# Patient Record
Sex: Female | Born: 1955 | Race: White | Hispanic: No | Smoking: Former smoker
Health system: Southern US, Community
[De-identification: ages and names within clinical notes are randomized; demographics above are authoritative.]

---

## 1998-05-28 ENCOUNTER — Other Ambulatory Visit: Admission: RE | Admit: 1998-05-28 | Discharge: 1998-05-28 | Payer: Self-pay | Admitting: Gynecology

## 1999-09-26 ENCOUNTER — Other Ambulatory Visit: Admission: RE | Admit: 1999-09-26 | Discharge: 1999-09-26 | Payer: Self-pay | Admitting: Gynecology

## 1999-12-30 ENCOUNTER — Other Ambulatory Visit: Admission: RE | Admit: 1999-12-30 | Discharge: 1999-12-30 | Payer: Self-pay | Admitting: Gynecology

## 2000-01-06 ENCOUNTER — Ambulatory Visit (HOSPITAL_COMMUNITY): Admission: RE | Admit: 2000-01-06 | Discharge: 2000-01-06 | Payer: Self-pay

## 2000-01-06 ENCOUNTER — Encounter: Payer: Self-pay | Admitting: Gynecology

## 2000-11-19 ENCOUNTER — Other Ambulatory Visit: Admission: RE | Admit: 2000-11-19 | Discharge: 2000-11-19 | Payer: Self-pay | Admitting: Gynecology

## 2001-01-21 ENCOUNTER — Ambulatory Visit (HOSPITAL_COMMUNITY): Admission: RE | Admit: 2001-01-21 | Discharge: 2001-01-21 | Payer: Self-pay | Admitting: Gynecology

## 2001-01-21 ENCOUNTER — Encounter: Payer: Self-pay | Admitting: Gynecology

## 2004-02-03 ENCOUNTER — Emergency Department (HOSPITAL_COMMUNITY): Admission: EM | Admit: 2004-02-03 | Discharge: 2004-02-03 | Payer: Self-pay | Admitting: Family Medicine

## 2005-05-24 ENCOUNTER — Ambulatory Visit (HOSPITAL_COMMUNITY): Admission: RE | Admit: 2005-05-24 | Discharge: 2005-05-24 | Payer: Self-pay | Admitting: Chiropractic Medicine

## 2005-12-08 ENCOUNTER — Other Ambulatory Visit: Admission: RE | Admit: 2005-12-08 | Discharge: 2005-12-08 | Payer: Self-pay | Admitting: Gynecology
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
## 2009-04-21 ENCOUNTER — Emergency Department (HOSPITAL_COMMUNITY): Admission: EM | Admit: 2009-04-21 | Discharge: 2009-04-21 | Payer: Self-pay | Admitting: Emergency Medicine

## 2011-01-30 ENCOUNTER — Encounter (HOSPITAL_COMMUNITY): Payer: Self-pay

## 2011-01-30 ENCOUNTER — Emergency Department (INDEPENDENT_AMBULATORY_CARE_PROVIDER_SITE_OTHER)
Admission: EM | Admit: 2011-01-30 | Discharge: 2011-01-30 | Disposition: A | Discharge status: Home / Self Care | Payer: BC Managed Care – PPO | Source: Home / Self Care | Attending: Family Medicine | Admitting: Family Medicine

## 2011-01-30 DIAGNOSIS — L272 Dermatitis due to ingested food: Secondary | ICD-10-CM

## 2011-01-30 MED ORDER — TRIAMCINOLONE ACETONIDE 40 MG/ML IJ SUSP
40.0000 mg | Freq: Once | INTRAMUSCULAR | Status: AC
  Administered 2011-01-30: 40 mg via INTRAMUSCULAR

## 2011-01-30 MED ORDER — HYDROXYZINE HCL 25 MG PO TABS: 25.0000 mg | ORAL_TABLET | Freq: Four times a day (QID) | ORAL | Status: AC

## 2011-01-30 MED ORDER — TRIAMCINOLONE ACETONIDE 40 MG/ML IJ SUSP
INTRAMUSCULAR | Status: AC
  Filled 2011-01-30: qty 5

## 2011-01-30 NOTE — ED Provider Notes (Signed)
History     CSN: [id number] Arrival date & time: 01/30/2011  9:34 AM   First MD Initiated Contact with Patient 01/30/11 1007      Chief Complaint  Patient presents with  . Allergic Reaction    (Consider location/radiation/quality/duration/timing/severity/associated sxs/prior treatment) Patient is a 55 y.o. female presenting with allergic reaction. The history is provided by the patient.  Allergic Reaction The primary symptoms are  nausea, diarrhea and rash. The primary symptoms do not include wheezing, shortness of breath, vomiting, angioedema or urticaria. The current episode started 13 to 24 hours ago. The problem has not changed since onset. The rash is associated with itching.  The onset of the reaction was associated with eating. Significant symptoms also include itching. Significant symptoms that are not present include eye redness or flushing.    History reviewed. No pertinent past medical history.  No past surgical history on file.  No family history on file.  History  Substance Use Topics  . Smoking status: Not on file  . Smokeless tobacco: Not on file  . Alcohol Use: Not on file    OB History    Grav Para Term Preterm Abortions TAB SAB Ect Mult Living                  Review of Systems  Constitutional: Negative.   Eyes: Negative for redness.  Respiratory: Negative for shortness of breath and wheezing.   Gastrointestinal: Positive for nausea and diarrhea. Negative for vomiting.  Genitourinary: Negative.   Skin: Positive for itching and rash. Negative for flushing.    Allergies  Review of patient's allergies indicates no known allergies.  Home Medications  No current outpatient prescriptions on file.  BP 105/71  Pulse 98  Temp(Src) 98 F (36.7 C) (Oral)  Resp 18  SpO2 100%  Physical Exam  Constitutional: She appears well-developed and well-nourished.  HENT:  Head: Normocephalic.  Mouth/Throat: Oropharynx is clear and moist.  Eyes: Pupils are  equal, round, and reactive to light.  Neck: Normal range of motion. Neck supple.  Cardiovascular: Normal rate, normal heart sounds and intact distal pulses.   Pulmonary/Chest: Effort normal and breath sounds normal.  Abdominal: Soft. Bowel sounds are normal.  Skin: Skin is warm and dry.    ED Course  Procedures (including critical care time)  Labs Reviewed - No data to display No results found.   No diagnosis found.    MDM          Barkley Bruns, MD 01/30/11 1026

## 2013-09-06 ENCOUNTER — Other Ambulatory Visit (HOSPITAL_COMMUNITY)
Admission: RE | Admit: 2013-09-06 | Discharge: 2013-09-06 | Disposition: A | Discharge status: Home / Self Care | Payer: BC Managed Care – PPO | Source: Ambulatory Visit | Attending: Women's Health | Admitting: Women's Health

## 2013-09-06 ENCOUNTER — Ambulatory Visit (INDEPENDENT_AMBULATORY_CARE_PROVIDER_SITE_OTHER): Payer: BC Managed Care – PPO | Admitting: Women's Health

## 2013-09-06 ENCOUNTER — Encounter: Payer: Self-pay | Admitting: Women's Health

## 2013-09-06 VITALS — BP 114/74 | Ht 66.0 in | Wt 153.2 lb

## 2013-09-06 DIAGNOSIS — R87810 Cervical high risk human papillomavirus (HPV) DNA test positive: Secondary | ICD-10-CM | POA: Insufficient documentation

## 2013-09-06 DIAGNOSIS — C4491 Basal cell carcinoma of skin, unspecified: Secondary | ICD-10-CM

## 2013-09-06 DIAGNOSIS — Z01419 Encounter for gynecological examination (general) (routine) without abnormal findings: Secondary | ICD-10-CM | POA: Insufficient documentation

## 2013-09-06 DIAGNOSIS — Z13220 Encounter for screening for lipoid disorders: Secondary | ICD-10-CM

## 2013-09-06 DIAGNOSIS — Z1151 Encounter for screening for human papillomavirus (HPV): Secondary | ICD-10-CM | POA: Insufficient documentation

## 2013-09-06 LAB — COMPREHENSIVE METABOLIC PANEL
ALBUMIN: 4.4 g/dL (ref 3.5–5.2)
ALT: 14 U/L (ref 0–35)
AST: 18 U/L (ref 0–37)
Alkaline Phosphatase: 58 U/L (ref 39–117)
BUN: 12 mg/dL (ref 6–23)
CALCIUM: 9.6 mg/dL (ref 8.4–10.5)
CHLORIDE: 96 meq/L (ref 96–112)
CO2: 28 mEq/L (ref 19–32)
Creat: 0.55 mg/dL (ref 0.50–1.10)
Glucose, Bld: 87 mg/dL (ref 70–99)
Potassium: 4.6 mEq/L (ref 3.5–5.3)
Sodium: 132 mEq/L — ABNORMAL LOW (ref 135–145)
Total Bilirubin: 0.7 mg/dL (ref 0.2–1.2)
Total Protein: 6.6 g/dL (ref 6.0–8.3)

## 2013-09-06 LAB — CBC WITH DIFFERENTIAL/PLATELET
Basophils Absolute: 0.1 10*3/uL (ref 0.0–0.1)
Basophils Relative: 1 % (ref 0–1)
Eosinophils Absolute: 0.1 10*3/uL (ref 0.0–0.7)
Eosinophils Relative: 1 % (ref 0–5)
HEMATOCRIT: 39.7 % (ref 36.0–46.0)
Hemoglobin: 13.2 g/dL (ref 12.0–15.0)
Lymphocytes Relative: 20 % (ref 12–46)
Lymphs Abs: 1.3 10*3/uL (ref 0.7–4.0)
MCH: 29.2 pg (ref 26.0–34.0)
MCHC: 33.2 g/dL (ref 30.0–36.0)
MCV: 87.8 fL (ref 78.0–100.0)
MONO ABS: 0.3 10*3/uL (ref 0.1–1.0)
MONOS PCT: 5 % (ref 3–12)
Neutro Abs: 4.7 10*3/uL (ref 1.7–7.7)
Neutrophils Relative %: 73 % (ref 43–77)
Platelets: 295 10*3/uL (ref 150–400)
RBC: 4.52 MIL/uL (ref 3.87–5.11)
RDW: 14 % (ref 11.5–15.5)
WBC: 6.5 10*3/uL (ref 4.0–10.5)

## 2013-09-06 LAB — LIPID PANEL
Cholesterol: 200 mg/dL (ref 0–200)
HDL: 89 mg/dL (ref >39)
LDL Cholesterol: 99 mg/dL (ref 0–99)
Total CHOL/HDL Ratio: 2.2 Ratio
Triglycerides: 62 mg/dL (ref <150)
VLDL: 12 mg/dL (ref 0–40)

## 2013-09-06 MED ORDER — ALPRAZOLAM 0.25 MG PO TABS
0.2500 mg | ORAL_TABLET | Freq: Every evening | ORAL | Status: DC | PRN
Start: 2013-09-06 — End: 2014-06-29

## 2013-09-06 NOTE — Patient Instructions (Signed)
Health Recommendations for Postmenopausal Women Respected and ongoing research has looked at the most common causes of death, disability, and poor quality of life in postmenopausal women. The causes include heart disease, diseases of blood vessels, diabetes, depression, cancer, and bone loss (osteoporosis). Many things can be done to help lower the chances of developing these and other common problems: CARDIOVASCULAR DISEASE Heart Disease: A heart attack is a medical emergency. Know the signs and symptoms of a heart attack. Below are things women can do to reduce their risk for heart disease.   Do not smoke. If you smoke, quit.  Aim for a healthy weight. Being overweight causes many preventable deaths. Eat a healthy and balanced diet and drink an adequate amount of liquids.  Get moving. Make a commitment to be more physically active. Aim for 30 minutes of activity on most, if not all days of the week.  Eat for heart health. Choose a diet that is low in saturated fat and cholesterol and eliminate trans fat. Include whole grains, vegetables, and fruits. Read and understand the labels on food containers before buying.  Know your numbers. Ask your caregiver to check your blood pressure, cholesterol (total, HDL, LDL, triglycerides) and blood glucose. Work with your caregiver on improving your entire clinical picture.  High blood pressure. Limit or stop your table salt intake (try salt substitute and food seasonings). Avoid salty foods and drinks. Read labels on food containers before buying. Eating well and exercising can help control high blood pressure. STROKE  Stroke is a medical emergency. Stroke may be the result of a blood clot in a blood vessel in the brain or by a brain hemorrhage (bleeding). Know the signs and symptoms of a stroke. To lower the risk of developing a stroke:  Avoid fatty foods.  Quit smoking.  Control your diabetes, blood pressure, and irregular heart rate. THROMBOPHLEBITIS  (BLOOD CLOT) OF THE LEG  Becoming overweight and leading a stationary lifestyle may also contribute to developing blood clots. Controlling your diet and exercising will help lower the risk of developing blood clots. CANCER SCREENING  Breast Cancer: Take steps to reduce your risk of breast cancer.  You should practice "breast self-awareness." This means understanding the normal appearance and feel of your breasts and should include breast self-examination. Any changes detected, no matter how small, should be reported to your caregiver.  After age 40, you should have a clinical breast exam (CBE) every year.  Starting at age 40, you should consider having a mammogram (breast X-ray) every year.  If you have a family history of breast cancer, talk to your caregiver about genetic screening.  If you are at high risk for breast cancer, talk to your caregiver about having an MRI and a mammogram every year.  Intestinal or Stomach Cancer: Tests to consider are a rectal exam, fecal occult blood, sigmoidoscopy, and colonoscopy. Women who are high risk may need to be screened at an earlier age and more often.  Cervical Cancer:  Beginning at age 30, you should have a Pap test every 3 years as long as the past 3 Pap tests have been normal.  If you have had past treatment for cervical cancer or a condition that could lead to cancer, you need Pap tests and screening for cancer for at least 20 years after your treatment.  If you had a hysterectomy for a problem that was not cancer or a condition that could lead to cancer, then you no longer need Pap tests.    If you are between ages 65 and 70, and you have had normal Pap tests going back 10 years, you no longer need Pap tests.  If Pap tests have been discontinued, risk factors (such as a new sexual partner) need to be reassessed to determine if screening should be resumed.  Some medical problems can increase the chance of getting cervical cancer. In these  cases, your caregiver may recommend more frequent screening and Pap tests.  Uterine Cancer: If you have vaginal bleeding after reaching menopause, you should notify your caregiver.  Ovarian cancer: Other than yearly pelvic exams, there are no reliable tests available to screen for ovarian cancer at this time except for yearly pelvic exams.  Lung Cancer: Yearly chest X-rays can detect lung cancer and should be done on high risk women, such as cigarette smokers and women with chronic lung disease (emphysema).  Skin Cancer: A complete body skin exam should be done at your yearly examination. Avoid overexposure to the sun and ultraviolet light lamps. Use a strong sun block cream when in the sun. All of these things are important in lowering the risk of skin cancer. MENOPAUSE Menopause Symptoms: Hormone therapy products are effective for treating symptoms associated with menopause:  Moderate to severe hot flashes.  Night sweats.  Mood swings.  Headaches.  Tiredness.  Loss of sex drive.  Insomnia.  Other symptoms. Hormone replacement carries certain risks, especially in older women. Women who use or are thinking about using estrogen or estrogen with progestin treatments should discuss that with their caregiver. Your caregiver will help you understand the benefits and risks. The ideal dose of hormone replacement therapy is not known. The Food and Drug Administration (FDA) has concluded that hormone therapy should be used only at the lowest doses and for the shortest amount of time to reach treatment goals.  OSTEOPOROSIS Protecting Against Bone Loss and Preventing Fracture: If you use hormone therapy for prevention of bone loss (osteoporosis), the risks for bone loss must outweigh the risk of the therapy. Ask your caregiver about other medications known to be safe and effective for preventing bone loss and fractures. To guard against bone loss or fractures, the following is recommended:  If  you are less than age 50, take 1000 mg of calcium and at least 600 mg of Vitamin D per day.  If you are greater than age 50 but less than age 70, take 1200 mg of calcium and at least 600 mg of Vitamin D per day.  If you are greater than age 70, take 1200 mg of calcium and at least 800 mg of Vitamin D per day. Smoking and excessive alcohol intake increases the risk of osteoporosis. Eat foods rich in calcium and vitamin D and do weight bearing exercises several times a week as your caregiver suggests. DIABETES Diabetes Melitus: If you have Type I or Type 2 diabetes, you should keep your blood sugar under control with diet, exercise and recommended medication. Avoid too many sweets, starchy and fatty foods. Being overweight can make control more difficult. COGNITION AND MEMORY Cognition and Memory: Menopausal hormone therapy is not recommended for the prevention of cognitive disorders such as Alzheimer's disease or memory loss.  DEPRESSION  Depression may occur at any age, but is common in elderly women. The reasons may be because of physical, medical, social (loneliness), or financial problems and needs. If you are experiencing depression because of medical problems and control of symptoms, talk to your caregiver about this. Physical activity and   exercise may help with mood and sleep. Community and volunteer involvement may help your sense of value and worth. If you have depression and you feel that the problem is getting worse or becoming severe, talk to your caregiver about treatment options that are best for you. ACCIDENTS  Accidents are common and can be serious in the elderly woman. Prepare your house to prevent accidents. Eliminate throw rugs, place hand bars in the bath, shower and toilet areas. Avoid wearing high heeled shoes or walking on wet, snowy, and icy areas. Limit or stop driving if you have vision or hearing problems, or you feel you are unsteady with you movements and  reflexes. HEPATITIS C Hepatitis C is a type of viral infection affecting the liver. It is spread mainly through contact with blood from an infected person. It can be treated, but if left untreated, it can lead to severe liver damage over years. Many people who are infected do not know that the virus is in their blood. If you are a "baby-boomer", it is recommended that you have one screening test for Hepatitis C. IMMUNIZATIONS  Several immunizations are important to consider having during your senior years, including:   Tetanus, diptheria, and pertussis booster shot.  Influenza every year before the flu season begins.  Pneumonia vaccine.  Shingles vaccine.  Others as indicated based on your specific needs. Talk to your caregiver about these. Document Released: 04/17/2005 Document Revised: 02/10/2012 Document Reviewed: 12/12/2007 [facility] Patient Information 2015 [place], [place]. This information is not intended to replace advice given to you by your health care provider. Make sure you discuss any questions you have with your health care provider.

## 2013-09-06 NOTE — Progress Notes (Signed)
Erica Tapia 1955-09-18 [id number]    History:    Presents for annual exam.  Postmenopausal/no HRT/no bleeding. Has had minimal healthcare in the past 10 years, was started on Zoloft 50 per primary care in January which has helped, requested something for air flights. Smokes one to 2 cigarettes weekly. History of normal mammograms last 1 2002. Cryo- greater than 20 years ago and normal Paps after. Has not had a colonoscopy. 2015 basal skin cancer right cheek has followup scheduled.  Past medical history, past surgical history, family history and social history were all reviewed and documented in the EPIC chart. Works for the [employer].  ROS:  A  12 point ROS was performed and pertinent positives and negatives are included.  Exam:  Filed Vitals:   09/06/13 0857  BP: 114/74    General appearance:  Normal Thyroid:  Symmetrical, normal in size, without palpable masses or nodularity. Respiratory  Auscultation:  Clear without wheezing or rhonchi Cardiovascular  Auscultation:  Regular rate, without rubs, murmurs or gallops  Edema/varicosities:  Not grossly evident Abdominal  Soft,nontender, without masses, guarding or rebound.  Liver/spleen:  No organomegaly noted  Hernia:  None appreciated  Skin  Inspection:  Grossly normal   Breasts: Examined lying and sitting.     Right: Without masses, retractions, discharge or axillary adenopathy.     Left: Without masses, retractions, discharge or axillary adenopathy. Gentitourinary   Inguinal/mons:  Normal without inguinal adenopathy  External genitalia:  Normal  BUS/Urethra/Skene's glands:  Normal  Vagina:  Normal  Cervix:  Normal  Uterus:   normal in size, shape and contour.  Midline and mobile  Adnexa/parametria:     Rt: Without masses or tenderness.   Lt: Without masses or tenderness.  Anus and perineum: Normal  Digital rectal exam: Normal sphincter tone without palpated masses or tenderness  Assessment/Plan:  58 y.o. MWF G0  for annual exam.   Minimal healthcare approximately 10 years Postmenopausal/no HRT/no bleeding Anxiety/depression stable on Zoloft per primary care Minimal smoker Basal skin cancer face has followup scheduled  Plan: Aware of need for no smoking, plans to quit. SBE's, breast center number and information given and reviewed instructed to schedule ASAP. Colonoscopy, instructed to schedule. Xanax 0.25 when necessary for air flights prescription, proper use, aware of addictive properties. CBC, comprehensive metabolic panel, lipid panel, vitamin D, UA, Pap with HR HPV typing. Schedule DEXA here.   Note: This dictation was prepared with Dragon/digital dictation.  Any transcriptional errors that result are unintentional. PHILOMENE, HAFF [facility], 9:39 AM 09/06/2013

## 2013-09-06 NOTE — Addendum Note (Signed)
Addended by: Alen Blew on: 09/06/2013 10:00 AM   Modules accepted: Orders

## 2013-09-07 LAB — URINALYSIS W MICROSCOPIC + REFLEX CULTURE
Bacteria, UA: NONE SEEN
Bilirubin Urine: NEGATIVE
CASTS: NONE SEEN
CRYSTALS: NONE SEEN
GLUCOSE, UA: NEGATIVE mg/dL
Hgb urine dipstick: NEGATIVE
Ketones, ur: NEGATIVE mg/dL
LEUKOCYTES UA: NEGATIVE
Nitrite: NEGATIVE
PH: 6 (ref 5.0–8.0)
Protein, ur: NEGATIVE mg/dL
SQUAMOUS EPITHELIAL / LPF: NONE SEEN
Urobilinogen, UA: 0.2 mg/dL (ref 0.0–1.0)

## 2013-09-07 LAB — CYTOLOGY - PAP

## 2013-09-07 LAB — VITAMIN D 25 HYDROXY (VIT D DEFICIENCY, FRACTURES): Vit D, 25-Hydroxy: 20 ng/mL — ABNORMAL LOW (ref 30–89)

## 2013-09-14 ENCOUNTER — Other Ambulatory Visit: Payer: Self-pay | Admitting: Women's Health

## 2013-09-14 ENCOUNTER — Other Ambulatory Visit: Payer: Self-pay

## 2013-09-14 DIAGNOSIS — E559 Vitamin D deficiency, unspecified: Secondary | ICD-10-CM

## 2013-09-14 MED ORDER — VITAMIN D (ERGOCALCIFEROL) 1.25 MG (50000 UNIT) PO CAPS: 50000.0000 [IU] | ORAL_CAPSULE | ORAL | Status: AC

## 2013-11-08 ENCOUNTER — Other Ambulatory Visit: Payer: Self-pay | Admitting: Gynecology

## 2013-11-08 DIAGNOSIS — Z13820 Encounter for screening for osteoporosis: Secondary | ICD-10-CM

## 2013-11-23 ENCOUNTER — Other Ambulatory Visit: Payer: Self-pay | Admitting: Gynecology

## 2013-11-23 ENCOUNTER — Ambulatory Visit (INDEPENDENT_AMBULATORY_CARE_PROVIDER_SITE_OTHER): Payer: BC Managed Care – PPO

## 2013-11-23 DIAGNOSIS — M949 Disorder of cartilage, unspecified: Secondary | ICD-10-CM

## 2013-11-23 DIAGNOSIS — M899 Disorder of bone, unspecified: Secondary | ICD-10-CM

## 2013-11-23 DIAGNOSIS — Z13820 Encounter for screening for osteoporosis: Secondary | ICD-10-CM

## 2013-11-23 DIAGNOSIS — M8580 Other specified disorders of bone density and structure, unspecified site: Secondary | ICD-10-CM

## 2013-11-27 ENCOUNTER — Other Ambulatory Visit: Payer: Self-pay | Admitting: *Deleted

## 2013-12-01 ENCOUNTER — Other Ambulatory Visit: Payer: BC Managed Care – PPO

## 2013-12-01 DIAGNOSIS — E559 Vitamin D deficiency, unspecified: Secondary | ICD-10-CM

## 2013-12-02 LAB — VITAMIN D 25 HYDROXY (VIT D DEFICIENCY, FRACTURES): VIT D 25 HYDROXY: 72 ng/mL (ref 30–89)

## 2013-12-15 ENCOUNTER — Ambulatory Visit: Payer: BC Managed Care – PPO | Admitting: Gynecology

## 2014-01-08 ENCOUNTER — Ambulatory Visit: Payer: BC Managed Care – PPO | Admitting: Gynecology

## 2014-01-10 ENCOUNTER — Emergency Department (HOSPITAL_COMMUNITY)
Admission: EM | Admit: 2014-01-10 | Discharge: 2014-01-10 | Disposition: A | Discharge status: Home / Self Care | Payer: BC Managed Care – PPO | Source: Home / Self Care | Attending: Emergency Medicine | Admitting: Emergency Medicine

## 2014-01-10 ENCOUNTER — Encounter (HOSPITAL_COMMUNITY): Payer: Self-pay | Admitting: *Deleted

## 2014-01-10 DIAGNOSIS — L42 Pityriasis rosea: Secondary | ICD-10-CM

## 2014-01-10 LAB — POCT RAPID STREP A: Streptococcus, Group A Screen (Direct): NEGATIVE

## 2014-01-10 MED ORDER — HYDROXYZINE HCL 10 MG PO TABS: ORAL_TABLET | ORAL | Status: AC

## 2014-01-10 NOTE — ED Provider Notes (Signed)
CSN: [id number]     Arrival date & time 01/10/14  0973 History   First MD Initiated Contact with Patient 01/10/14 [phone]     Chief Complaint  Patient presents with  . Rash   (Consider location/radiation/quality/duration/timing/severity/associated sxs/prior Treatment) HPI Comments: No previous episodes No known new exposures No household members with same No fever/malaise  Patient is a 58 y.o. female presenting with rash. The history is provided by the patient.  Rash Location:  Head/neck, torso, shoulder/arm and leg Head/neck rash location:  L neck and R neck Shoulder/arm rash location:  L shoulder, R shoulder, L arm, R arm, L upper arm, R upper arm, L forearm and R forearm Torso rash location:  L chest, R chest, R flank, L flank, upper back, lower back, abd LUQ, abd LLQ, abd RUQ and abd RLQ Leg rash location:  L upper leg and R upper leg Quality: itchiness and redness   Quality: not blistering, not bruising, not burning, not painful, not scaling and not swelling   Onset quality:  Gradual Duration:  1 week Timing:  Constant Progression:  Spreading Chronicity:  New Associated symptoms: sore throat and URI     History reviewed. No pertinent past medical history. History reviewed. No pertinent past surgical history. Family History  Problem Relation Age of Onset  . Diabetes Mother   . Heart attack Mother   . Diabetes Maternal Aunt    History  Substance Use Topics  . Smoking status: Current Some Day Smoker    Types: Cigarettes  . Smokeless tobacco: Never Used  . Alcohol Use: Yes     Comment: beer   OB History    Gravida Para Term Preterm AB TAB SAB Ectopic Multiple Living   0              Review of Systems  HENT: Positive for sore throat.   Skin: Positive for rash.  All other systems reviewed and are negative.   Allergies  Review of patient's allergies indicates no known allergies.  Home Medications   Prior to Admission medications   Medication Sig Start Date  End Date Taking? Authorizing Provider  ALPRAZolam (XANAX) 0.25 MG tablet Take 1 tablet (0.25 mg total) by mouth at bedtime as needed for anxiety. 09/06/13   Huel Cote, NP  Ascorbic Acid (VITAMIN C PO) Take by mouth.    Historical Provider, MD  Cholecalciferol (VITAMIN D PO) Take by mouth.    Historical Provider, MD  hydrOXYzine (ATARAX/VISTARIL) 10 MG tablet 1-2 tabs po TID prn itching 01/10/14   Lutricia Feil, PA  Multiple Vitamin (MULTIVITAMIN) tablet Take 1 tablet by mouth daily.    Historical Provider, MD  sertraline (ZOLOFT) 50 MG tablet Take 50 mg by mouth daily.    Historical Provider, MD  Vitamin D, Ergocalciferol, (DRISDOL) 50000 UNITS CAPS capsule Take 1 capsule (50,000 Units total) by mouth every 7 (seven) days. 09/14/13   Huel Cote, NP   BP 113/75 mmHg  Pulse 96  Temp(Src) 99.6 F (37.6 C) (Oral)  Resp 16  SpO2 96% Physical Exam  Constitutional: She is oriented to person, place, and time. She appears well-developed and well-nourished. No distress.  HENT:  Head: Normocephalic and atraumatic.  Right Ear: Hearing, tympanic membrane, external ear and ear canal normal.  Left Ear: Hearing, tympanic membrane, external ear and ear canal normal.  Nose: Nose normal.  Mouth/Throat: Uvula is midline, oropharynx is clear and moist and mucous membranes are normal. No oral lesions. No  uvula swelling.  Eyes: Conjunctivae are normal. No scleral icterus.  Neck: Normal range of motion. Neck supple.  Cardiovascular: Normal rate, regular rhythm and normal heart sounds.   Pulmonary/Chest: Effort normal and breath sounds normal. No respiratory distress. She has no wheezes.  Musculoskeletal: Normal range of motion.  Lymphadenopathy:    She has no cervical adenopathy.  Neurological: She is alert and oriented to person, place, and time.  Skin: Skin is warm and dry. Rash noted.  Diffuse blanchable discrete dry erythematous macules of variable size at base of neck, over torso and back,  B/L UEs and bilateral inner thighs  Psychiatric: She has a normal mood and affect. Her behavior is normal.  Nursing note and vitals reviewed.   ED Course  Procedures (including critical care time) Labs Review Labs Reviewed  POCT RAPID STREP A ([facility]RG CARE ONLY)    Imaging Review No results found.   MDM   1. Pityriasis rosea   Strep test negative Atarax as prescribed for pruritis Advised patient regarding condition and expected self limited course   Lutricia Feil, PA 01/10/14 1008

## 2014-01-10 NOTE — ED Notes (Signed)
Rash        Over  Top of  Torso     Red        Lesions      No angioedema         The     Pt  Is  Sitting  Upright on  The  Exam   tablespeaking in  Complete   sentances       Symptoms  X  1  Week      Pt reports   A  sorethroat         As  Well

## 2014-01-10 NOTE — Discharge Instructions (Signed)
Strep test was negative.  Pityriasis Rosea Pityriasis rosea is a rash which is probably caused by a virus. It generally starts as a scaly, red patch on the trunk (the area of the body that a t-shirt would cover) but does not appear on sun exposed areas. The rash is usually preceded by an initial larger spot called the "herald patch" a week or more before the rest of the rash appears. Generally within one to two days the rash appears rapidly on the trunk, upper arms, and sometimes the upper legs. The rash usually appears as flat, oval patches of scaly pink color. The rash can also be raised and one is able to feel it with a finger. The rash can also be finely crinkled and may slough off leaving a ring of scale around the spot. Sometimes a mild sore throat is present with the rash. It usually affects children and young adults in the spring and autumn. Women are more frequently affected than men. TREATMENT  Pityriasis rosea is a self-limited condition. This means it goes away within 4 to 8 weeks without treatment. The spots may persist for several months, especially in darker-colored skin after the rash has resolved and healed. Benadryl and steroid creams may be used if itching is a problem. SEEK MEDICAL CARE IF:   Your rash does not go away or persists longer than three months.  You develop fever and joint pain.  You develop severe headache and confusion.  You develop breathing difficulty, vomiting and/or extreme weakness. Document Released: 04/01/2001 Document Revised: 05/18/2011 Document Reviewed: 04/20/2008 [facility] Patient Information 2015 [place], [place]. This information is not intended to replace advice given to you by your health care provider. Make sure you discuss any questions you have with your health care provider.

## 2014-01-12 LAB — CULTURE, GROUP A STREP

## 2014-01-17 ENCOUNTER — Encounter: Payer: Self-pay | Admitting: Gynecology

## 2014-01-17 ENCOUNTER — Ambulatory Visit (INDEPENDENT_AMBULATORY_CARE_PROVIDER_SITE_OTHER): Payer: BC Managed Care – PPO | Admitting: Gynecology

## 2014-01-17 VITALS — BP 130/80

## 2014-01-17 DIAGNOSIS — Z23 Encounter for immunization: Secondary | ICD-10-CM

## 2014-01-17 DIAGNOSIS — R8789 Other abnormal findings in specimens from female genital organs: Secondary | ICD-10-CM

## 2014-01-17 DIAGNOSIS — R87618 Other abnormal cytological findings on specimens from cervix uteri: Secondary | ICD-10-CM | POA: Insufficient documentation

## 2014-01-17 DIAGNOSIS — B977 Papillomavirus as the cause of diseases classified elsewhere: Secondary | ICD-10-CM

## 2014-01-17 NOTE — Progress Notes (Signed)
   Patient is a 58 year old who presented to the office today for colposcopic evaluation as a result of her recent Pap smear at time of her annual exam which demonstrated the following:  Normal Pap smear but:  HPV 16,18/45 Genotyping "POSITIVE" HPV 18/45 Completed by [id number] on 2013-09-13 HPV High Risk **DETECTED**  Patient stated she had cryotherapy of her cervix over 20 years ago and subsequent Pap smears have been normal.  Colposcopic exam: Patient underwent extensive colposcopic evaluation of the external genitalia, perineum, perirectal region. The speculum was introduced into the vagina and a systematic inspection of the vagina cervix and fornices were undertaken. Acetic acid was then applied. The cervix required dilatation effort to obtain an ECC since no lesions were seen on the cervix. Upon manipulating the speculum she was noted to have 2 separate islands of leukoplakic area which were respectively biopsy. V THIS APPEARS TO BE POSSIBLY  VAIN-1.  Assessment/plan: Patient with normal Pap smear but HPV 1845 were identified. Biopsy obtained results pending at time of this dictation will notify patient results and manage accordingly. Patient received the T dTap Vaccine today.

## 2014-01-17 NOTE — Patient Instructions (Signed)
Tdap Vaccine (Tetanus, Diphtheria, Pertussis): What You Need to Know 1. Why get vaccinated? Tetanus, diphtheria and pertussis can be very serious diseases, even for adolescents and adults. Tdap vaccine can protect us from these diseases. TETANUS (Lockjaw) causes painful muscle tightening and stiffness, usually all over the body.  It can lead to tightening of muscles in the head and neck so you can't open your mouth, swallow, or sometimes even breathe. Tetanus kills about 1 out of 5 people who are infected. DIPHTHERIA can cause a thick coating to form in the back of the throat.  It can lead to breathing problems, paralysis, heart failure, and death. PERTUSSIS (Whooping Cough) causes severe coughing spells, which can cause difficulty breathing, vomiting and disturbed sleep.  It can also lead to weight loss, incontinence, and rib fractures. Up to 2 in 100 adolescents and 5 in 100 adults with pertussis are hospitalized or have complications, which could include pneumonia or death. These diseases are caused by bacteria. Diphtheria and pertussis are spread from person to person through coughing or sneezing. Tetanus enters the body through cuts, scratches, or wounds. Before vaccines, the [place] saw as many as 200,000 cases a year of diphtheria and pertussis, and hundreds of cases of tetanus. Since vaccination began, tetanus and diphtheria have dropped by about 99% and pertussis by about 80%. 2. Tdap vaccine Tdap vaccine can protect adolescents and adults from tetanus, diphtheria, and pertussis. One dose of Tdap is routinely given at age 11 or 12. People who did not get Tdap at that age should get it as soon as possible. Tdap is especially important for health care professionals and anyone having close contact with a baby younger than 12 months. Pregnant women should get a dose of Tdap during every pregnancy, to protect the newborn from pertussis. Infants are most at risk for severe, life-threatening  complications from pertussis. A similar vaccine, called Td, protects from tetanus and diphtheria, but not pertussis. A Td booster should be given every 10 years. Tdap may be given as one of these boosters if you have not already gotten a dose. Tdap may also be given after a severe cut or burn to prevent tetanus infection. Your doctor can give you more information. Tdap may safely be given at the same time as other vaccines. 3. Some people should not get this vaccine  If you ever had a life-threatening allergic reaction after a dose of any tetanus, diphtheria, or pertussis containing vaccine, OR if you have a severe allergy to any part of this vaccine, you should not get Tdap. Tell your doctor if you have any severe allergies.  If you had a coma, or long or multiple seizures within 7 days after a childhood dose of DTP or DTaP, you should not get Tdap, unless a cause other than the vaccine was found. You can still get Td.  Talk to your doctor if you:  have epilepsy or another nervous system problem,  had severe pain or swelling after any vaccine containing diphtheria, tetanus or pertussis,  ever had Guillain-Barr Syndrome (GBS),  aren't feeling well on the day the shot is scheduled. 4. Risks of a vaccine reaction With any medicine, including vaccines, there is a chance of side effects. These are usually mild and go away on their own, but serious reactions are also possible. Brief fainting spells can follow a vaccination, leading to injuries from falling. Sitting or lying down for about 15 minutes can help prevent these. Tell your doctor if you feel   dizzy or light-headed, or have vision changes or ringing in the ears. Mild problems following Tdap (Did not interfere with activities)  Pain where the shot was given (about 3 in 4 adolescents or 2 in 3 adults)  Redness or swelling where the shot was given (about 1 person in 5)  Mild fever of at least 100.78F (up to about 1 in 25 adolescents or  1 in 100 adults)  Headache (about 3 or 4 people in 10)  Tiredness (about 1 person in 3 or 4)  Nausea, vomiting, diarrhea, stomach ache (up to 1 in 4 adolescents or 1 in 10 adults)  Chills, body aches, sore joints, rash, swollen glands (uncommon) Moderate problems following Tdap (Interfered with activities, but did not require medical attention)  Pain where the shot was given (about 1 in 5 adolescents or 1 in 100 adults)  Redness or swelling where the shot was given (up to about 1 in 16 adolescents or 1 in 25 adults)  Fever over 102F (about 1 in 100 adolescents or 1 in 250 adults)  Headache (about 3 in 20 adolescents or 1 in 10 adults)  Nausea, vomiting, diarrhea, stomach ache (up to 1 or 3 people in 100)  Swelling of the entire arm where the shot was given (up to about 3 in 100). Severe problems following Tdap (Unable to perform usual activities; required medical attention)  Swelling, severe pain, bleeding and redness in the arm where the shot was given (rare). A severe allergic reaction could occur after any vaccine (estimated less than 1 in a million doses). 5. What if there is a serious reaction? What should I look for?  Look for anything that concerns you, such as signs of a severe allergic reaction, very high fever, or behavior changes. Signs of a severe allergic reaction can include hives, swelling of the face and throat, difficulty breathing, a fast heartbeat, dizziness, and weakness. These would start a few minutes to a few hours after the vaccination. What should I do?  If you think it is a severe allergic reaction or other emergency that can't wait, call 9-1-1 or get the person to the nearest hospital. Otherwise, call your doctor.  Afterward, the reaction should be reported to the "Vaccine Adverse Event Reporting System" (VAERS). Your doctor might file this report, or you can do it yourself through the VAERS web site at www.vaers.SamedayNews.es, or by calling  531-741-7761. VAERS is only for reporting reactions. They do not give medical advice.  6. The National Vaccine Injury Compensation Program The [employer] Vaccine Injury Compensation Program (VICP) is a federal program that was created to compensate people who may have been injured by certain vaccines. Persons who believe they may have been injured by a vaccine can learn about the program and about filing a claim by calling 347 770 6279 or visiting the [place] website at GoldCloset.com.ee. 7. How can I learn more?  Ask your doctor.  Call your local or state health department.  Contact the Centers for Disease Control and Prevention (CDC):  Call (563) 010-0759 or visit CDC's website at http://hunter.com/. CDC Tdap Vaccine VIS (07/16/11) Document Released: 08/25/2011 Document Revised: 07/10/2013 Document Reviewed: 06/07/2013 ExitCare Patient Information 2015 [place], [place]. This information is not intended to replace advice given to you by your health care provider. Make sure you discuss any questions you have with your health care provider. Colposcopy Colposcopy is a procedure to examine your cervix and vagina, or the area around the outside of your vagina, for abnormalities or signs of  disease. The procedure is done using a lighted microscope called a colposcope. Tissue samples may be collected during the colposcopy if your health care provider finds any unusual cells. A colposcopy may be done if a woman has:  An abnormal Pap test. A Pap test is a medical test done to evaluate cells that are on the surface of the cervix.  A Pap test result that is suggestive of human papillomavirus (HPV). This virus can cause genital warts and is linked to the development of cervical cancer.  A sore on her cervix and the results of a Pap test were normal.  Genital warts on the cervix or in or around the outside of the vagina.  A mother who took the drug diethylstilbestrol (DES) while  pregnant.  Painful intercourse.  Vaginal bleeding, especially after sexual intercourse. LET [facility] CARE PROVIDER KNOW ABOUT:  Any allergies you have.  All medicines you are taking, including vitamins, herbs, eye drops, creams, and over-the-counter medicines.  Previous problems you or members of your family have had with the use of anesthetics.  Any blood disorders you have.  Previous surgeries you have had.  Medical conditions you have. RISKS AND COMPLICATIONS Generally, a colposcopy is a safe procedure. However, as with any procedure, complications can occur. Possible complications include:  Bleeding.  Infection.  Missed lesions. BEFORE THE PROCEDURE   Tell your health care provider if you have your menstrual period. A colposcopy typically is not done during menstruation.  For 24 hours before the colposcopy, do not:  [place].  Use tampons.  Use medicines, creams, or suppositories in the vagina.  Have sexual intercourse. PROCEDURE  During the procedure, you will be lying on your back with your feet in foot rests (stirrups). A warm metal or plastic instrument (speculum) will be placed in your vagina to keep it open and to allow the health care provider to see the cervix. The colposcope will be placed outside the vagina. It will be used to magnify and examine the cervix, vagina, and the area around the outside of the vagina. A small amount of liquid solution will be placed on the area that is to be viewed. This solution will make it easier to see the abnormal cells. Your health care provider will use tools to suck out mucus and cells from the canal of the cervix. Then he or she will record the location of the abnormal areas. If a biopsy is done during the procedure, a medicine will usually be given to numb the area (local anesthetic). You may feel mild pain or cramping while the biopsy is done. After the procedure, tissue samples collected during the biopsy will be sent to  a lab for analysis. AFTER THE PROCEDURE  You will be given instructions on when to follow up with your health care provider for your test results. It is important to keep your appointment. Document Released: 05/16/2002 Document Revised: 10/26/2012 Document Reviewed: 09/22/2012 [facility] Patient Information 2015 [place], [place]. This information is not intended to replace advice given to you by your health care provider. Make sure you discuss any questions you have with your health care provider.

## 2014-01-23 ENCOUNTER — Encounter: Payer: Self-pay | Admitting: Gynecology

## 2014-04-11 ENCOUNTER — Encounter (HOSPITAL_COMMUNITY): Payer: Self-pay | Admitting: *Deleted

## 2014-04-11 ENCOUNTER — Emergency Department (HOSPITAL_COMMUNITY)
Admission: EM | Admit: 2014-04-11 | Discharge: 2014-04-11 | Disposition: A | Discharge status: Home / Self Care | Payer: BC Managed Care – PPO | Source: Home / Self Care

## 2014-04-11 DIAGNOSIS — H00013 Hordeolum externum right eye, unspecified eyelid: Secondary | ICD-10-CM

## 2014-04-11 MED ORDER — CEPHALEXIN 500 MG PO CAPS: 500.0000 mg | ORAL_CAPSULE | Freq: Three times a day (TID) | ORAL | Status: DC

## 2014-04-11 MED ORDER — POLYMYXIN B-TRIMETHOPRIM 10000-0.1 UNIT/ML-% OP SOLN: 1.0000 [drp] | Freq: Four times a day (QID) | OPHTHALMIC | Status: DC

## 2014-04-11 MED ORDER — ERYTHROMYCIN 5 MG/GM OP OINT: TOPICAL_OINTMENT | OPHTHALMIC | Status: DC

## 2014-04-11 NOTE — Discharge Instructions (Signed)

## 2014-04-11 NOTE — ED Provider Notes (Signed)
   Chief Complaint   Eye Problem   History of Present Illness   GOLDIE TREGONING is a 59 year old female who's had a three-day history of a painful swelling on her right upper eyelid. She denies any redness of the eye itself, discharge, blurred vision. It hurts to blink her eyes. She denies any problem with lower lid of the left eye. No trauma or foreign body. She's never had anything like this before.  Review of Systems   Other than as noted above, the patient denies any of the following symptoms: Systemic:  No fever, chills, or headache. Eye:  No blurred vision, or diplopia. ENT:  No nasal congestion, rhinorrhea, or sore throat. Lymphatic:  No adenopathy. Skin:  No rash or pruritis.  [place]   Past medical history, family history, social history, meds, and allergies were reviewed.  Her only medication is Zoloft.  Physical Examination    Vital signs:  BP 124/78 mmHg  Pulse 72  Temp(Src) 98.6 F (37 C) (Oral)  Resp 16  SpO2 100% General:  Alert and in no distress. Eye:  There is an 22mm swollen, red, tender nodule at the mid right upper eyelid margin. There was a small pustule at this area. The globe itself appears normal. No conjunctival injection or discharge. Cornea was intact. Anterior chamber normal. PERRLA, full EOMs. ENT:  TMs and canals clear.  Nasal mucosa normal.  No intra-oral lesions, mucous membranes moist, pharynx clear. Neck:  No adenopathy tenderness or mass. Skin:  Clear, warm and dry.     Assessment   The encounter diagnosis was Hordeolum external, right.  Plan     1.  Meds:  The following meds were prescribed:   Discharge Medication List as of 04/11/2014 11:56 AM    START taking these medications   Details  cephALEXin (KEFLEX) 500 MG capsule Take 1 capsule (500 mg total) by mouth 3 (three) times daily., Starting 04/11/2014, Until Discontinued, Normal    erythromycin ophthalmic ointment Apply to right upper lid TID, Normal    trimethoprim-polymyxin b  (POLYTRIM) ophthalmic solution Place 1 drop into the right eye every 6 (six) hours., Starting 04/11/2014, Until Discontinued, Normal        2.  Patient Education/Counseling:  The patient was given appropriate handouts, self care instructions, and instructed in symptomatic relief.    3.  Follow up:  The patient was told to follow up here if no better in 3 to 4 days, or sooner if becoming worse in any way, and given some red flag symptoms such as increasing pain or changes in vision which would prompt immediate return.  Follow up here as needed.      Harden Mo, MD 04/11/14 [id number]

## 2014-04-11 NOTE — ED Notes (Signed)
Redness   And  Swelling  And  Some  Burning  To  r  Eye  Lid   No   Visual     involvement    Eye  Feels  Gritty           No  Injury    Symptoms  X  2  Days

## 2014-05-25 ENCOUNTER — Telehealth: Payer: Self-pay | Admitting: *Deleted

## 2014-05-25 MED ORDER — SERTRALINE HCL 50 MG PO TABS: 50.0000 mg | ORAL_TABLET | Freq: Every day | ORAL | Status: DC

## 2014-05-25 NOTE — Telephone Encounter (Signed)
Rx sent, pt informed. 

## 2014-05-25 NOTE — Telephone Encounter (Signed)
Pt no longer has PCP said at last OV back in July you said you could fill her Zoloft for her? Pt would like Rx sent.

## 2014-05-25 NOTE — Telephone Encounter (Signed)
Okay with refills through July.

## 2014-06-29 ENCOUNTER — Telehealth: Payer: Self-pay | Admitting: *Deleted

## 2014-06-29 DIAGNOSIS — F419 Anxiety disorder, unspecified: Secondary | ICD-10-CM

## 2014-06-29 MED ORDER — ALPRAZOLAM 0.25 MG PO TABS: 0.2500 mg | ORAL_TABLET | Freq: Every evening | ORAL | Status: AC | PRN

## 2014-06-29 NOTE — Telephone Encounter (Signed)
Okay for refill, Xanax 0.25 daily when necessary #30 with 1 refill.

## 2014-06-29 NOTE — Telephone Encounter (Signed)
Pt informed, rx called in

## 2014-06-29 NOTE — Telephone Encounter (Signed)
Pt calling requesting refill on xanax 0.25 mg annual due in July. Please advise

## 2016-07-22 ENCOUNTER — Encounter: Payer: Self-pay | Admitting: Gynecology

## 2016-12-30 ENCOUNTER — Ambulatory Visit (HOSPITAL_COMMUNITY)
Admission: EM | Admit: 2016-12-30 | Discharge: 2016-12-30 | Disposition: A | Discharge status: Home / Self Care | Payer: BC Managed Care – PPO | Attending: Family Medicine | Admitting: Family Medicine

## 2016-12-30 ENCOUNTER — Encounter (HOSPITAL_COMMUNITY): Payer: Self-pay | Admitting: Emergency Medicine

## 2016-12-30 DIAGNOSIS — F419 Anxiety disorder, unspecified: Secondary | ICD-10-CM

## 2016-12-30 DIAGNOSIS — H00012 Hordeolum externum right lower eyelid: Secondary | ICD-10-CM | POA: Diagnosis not present

## 2016-12-30 NOTE — ED Triage Notes (Signed)
Pt reports development of a stye to her right lower eye lid yesterday.

## 2016-12-30 NOTE — ED Provider Notes (Signed)
[facility]    CSN: [id number] Arrival date & time: 12/30/16  1002     History   Chief Complaint Chief Complaint  Patient presents with  . Stye    right    HPI Erica Tapia is a 61 y.o. female.   Patient is a 61 yo F who presents to urgent care with a stye. Started last night with some redness and itching, woke up this morning and noticed a stye on her R lower eyelid. She has no vision problems or eye drainage. No fever/chills. Has not tried anything for this at home.      History reviewed. No pertinent past medical history.  Patient Active Problem List   Diagnosis Date Noted  . Abnormal Papanicolaou smear of cervix with positive human papilloma virus (HPV) test 01/17/2014  . Skin cancer, basal cell 09/06/2013    History reviewed. No pertinent surgical history.  OB History    Gravida Para Term Preterm AB Living   0             SAB TAB Ectopic Multiple Live Births                   Home Medications    Prior to Admission medications   Medication Sig Start Date End Date Taking? Authorizing Provider  ALPRAZolam (XANAX) 0.25 MG tablet Take 1 tablet (0.25 mg total) by mouth at bedtime as needed for anxiety. 06/29/14   Huel Cote, NP  Ascorbic Acid (VITAMIN C PO) Take by mouth.    [provider]  cephALEXin (KEFLEX) 500 MG capsule Take 1 capsule (500 mg total) by mouth 3 (three) times daily. 04/11/14   Harden Mo, MD  Cholecalciferol (VITAMIN D PO) Take by mouth.    [provider]  erythromycin ophthalmic ointment Apply to right upper lid TID 04/11/14   Harden Mo, MD  hydrOXYzine (ATARAX/VISTARIL) 10 MG tablet 1-2 tabs po TID prn itching 01/10/14   Presson, Audelia Hives, PA  Multiple Vitamin (MULTIVITAMIN) tablet Take 1 tablet by mouth daily.    [provider]  sertraline (ZOLOFT) 50 MG tablet Take 1 tablet (50 mg total) by mouth daily. 05/25/14   Huel Cote, NP  trimethoprim-polymyxin b (POLYTRIM) ophthalmic  solution Place 1 drop into the right eye every 6 (six) hours. 04/11/14   Harden Mo, MD  Vitamin D, Ergocalciferol, (DRISDOL) 50000 UNITS CAPS capsule Take 1 capsule (50,000 Units total) by mouth every 7 (seven) days. 09/14/13   Huel Cote, NP    Family History Family History  Problem Relation Age of Onset  . Diabetes Mother   . Heart attack Mother   . Diabetes Maternal Aunt     Social History Social History  Substance Use Topics  . Smoking status: Former Smoker    Types: Cigarettes  . Smokeless tobacco: Never Used  . Alcohol use Yes     Comment: beer     Allergies   Patient has no known allergies.   Review of Systems Review of Systems  Constitutional: Negative for chills and fever.  HENT: Negative for congestion and rhinorrhea.   Eyes: Positive for redness and itching. Negative for pain, discharge and visual disturbance.  Gastrointestinal: Negative for nausea and vomiting.  Skin: Negative for rash.     Physical Exam Triage Vital Signs ED Triage Vitals [12/30/16 1019]  Enc Vitals Group     BP (!) 156/80     Pulse Rate 86  Resp      Temp 98.9 F (37.2 C)     Temp Source Oral     SpO2 96 %     Weight      Height      Head Circumference      Peak Flow      Pain Score      Pain Loc      Pain Edu?      Excl. in [place]?    No data found.   Updated Vital Signs BP (!) 156/80 (BP Location: Left Arm)   Pulse 86   Temp 98.9 F (37.2 C) (Oral)   SpO2 96%   Visual Acuity Right Eye Distance:   Left Eye Distance:   Bilateral Distance:    Right Eye Near:   Left Eye Near:    Bilateral Near:     Physical Exam  Constitutional: She is oriented to person, place, and time. She appears well-developed and well-nourished. No distress.  HENT:  Head: Normocephalic and atraumatic.  Nose: Nose normal.  Eyes: Pupils are equal, round, and reactive to light. Conjunctivae and EOM are normal.  Small tender 71mm erythematous nodule with tiny central pustule at R  lower eyelid lashline. No surrounding erythema or edema. Conjunctiva nonerythematous.  Neck: Normal range of motion. Neck supple.  Lymphadenopathy:    She has no cervical adenopathy.  Neurological: She is alert and oriented to person, place, and time.  Skin: Skin is warm and dry. Capillary refill takes less than 2 seconds. No rash noted. She is not diaphoretic.  Psychiatric: She has a normal mood and affect.     UC Treatments / Results  Labs (all labs ordered are listed, but only abnormal results are displayed) Labs Reviewed - No data to display  EKG  EKG Interpretation None       Radiology No results found.  Procedures Procedures (including critical care time)  Medications Ordered in UC Medications - No data to display   Initial Impression / Assessment and Plan / UC Course  I have reviewed the triage vital signs and the nursing notes.  Pertinent labs & imaging results that were available during my care of the patient were reviewed by me and considered in my medical decision making (see chart for details).   Patient has a small erythematous nodule at her R lower eyelid lashline consistent with external hordeolum since last night. She does not have any evidence on exam of surrounding edema or erythema and conjunctiva is nonerythematous as well. Reassured patient that given minor uncomplicated hordeolum without surrounding cellulitis that no need for antibiotics at this time. Advised supportive care at home with warm compresses and keep area clean. Given return precautions.   Final Clinical Impressions(s) / UC Diagnoses   Final diagnoses:  Hordeolum externum of right lower eyelid    New Prescriptions New Prescriptions   No medications on file     Bufford Lope, DO 12/30/16 1148

## 2016-12-30 NOTE — Discharge Instructions (Signed)
You can do warm compresses every 40min to help it drain. Keep the area clean with warm water and soap. No need for ointments or creams. Clean your makeup brushes and no makeup until it goes away.

## 2017-02-24 ENCOUNTER — Other Ambulatory Visit: Payer: Self-pay

## 2017-02-24 ENCOUNTER — Encounter (HOSPITAL_COMMUNITY): Payer: Self-pay | Admitting: Emergency Medicine

## 2017-02-24 ENCOUNTER — Ambulatory Visit (HOSPITAL_COMMUNITY)
Admission: EM | Admit: 2017-02-24 | Discharge: 2017-02-24 | Disposition: A | Discharge status: Home / Self Care | Payer: BC Managed Care – PPO | Attending: Family Medicine | Admitting: Family Medicine

## 2017-02-24 DIAGNOSIS — T23201A Burn of second degree of right hand, unspecified site, initial encounter: Secondary | ICD-10-CM | POA: Diagnosis not present

## 2017-02-24 DIAGNOSIS — X19XXXA Contact with other heat and hot substances, initial encounter: Secondary | ICD-10-CM | POA: Diagnosis not present

## 2017-02-24 MED ORDER — SILVER SULFADIAZINE 1 % EX CREA: 1.0000 "application " | TOPICAL_CREAM | Freq: Every day | CUTANEOUS | 0 refills | Status: AC

## 2017-02-24 NOTE — ED Triage Notes (Addendum)
Removing hot chili from a microwave, chili spilled on right hand.  Redness, pain, blister is popped.  Redness around wrist.   Incident occurred last night.  Last tdap 2015

## 2017-02-24 NOTE — Discharge Instructions (Signed)
Keep burn clean and dry. Apply prescribed clean once a day along with a clean dry dressing. Wash burn daily with soap and water. If develop signs of infection, increased pain, drainage, redness or otherwise not improving please follow up here or with your primary care provider.

## 2017-02-24 NOTE — ED Provider Notes (Signed)
[facility]    CSN: [id number] Arrival date & time: 02/24/17  1025     History   Chief Complaint Chief Complaint  Patient presents with  . Hand Burn    HPI Erica Tapia is a 61 y.o. female.   Erica Tapia presents with complaints of burn to her right hand which she developed last night after chili spilled onto her hand as she was removing it from the microwave.it blisterd, but blister has since popped and drained. Mild pain. tdap 2015. Has not applied anything to burn today. Full ROM to hand and fingers, sensation intact.    ROS per HPI.       History reviewed. No pertinent past medical history.  Patient Active Problem List   Diagnosis Date Noted  . Abnormal Papanicolaou smear of cervix with positive human papilloma virus (HPV) test 01/17/2014  . Skin cancer, basal cell 09/06/2013    History reviewed. No pertinent surgical history.  OB History    Gravida Para Term Preterm AB Living   0             SAB TAB Ectopic Multiple Live Births                   Home Medications    Prior to Admission medications   Medication Sig Start Date End Date Taking? Authorizing Provider  ALPRAZolam (XANAX) 0.25 MG tablet Take 1 tablet (0.25 mg total) by mouth at bedtime as needed for anxiety. 06/29/14   Huel Cote, NP  Ascorbic Acid (VITAMIN C PO) Take by mouth.    [provider]  Cholecalciferol (VITAMIN D PO) Take by mouth.    [provider]  hydrOXYzine (ATARAX/VISTARIL) 10 MG tablet 1-2 tabs po TID prn itching 01/10/14   Presson, Annett Gula H, PA  Multiple Vitamin (MULTIVITAMIN) tablet Take 1 tablet by mouth daily.    [provider]  silver sulfADIAZINE (SILVADENE) 1 % cream Apply 1 application topically daily. 02/24/17   Zigmund Gottron, NP  Vitamin D, Ergocalciferol, (DRISDOL) 50000 UNITS CAPS capsule Take 1 capsule (50,000 Units total) by mouth every 7 (seven) days. 09/14/13   Huel Cote, NP    Family History Family History    Problem Relation Age of Onset  . Diabetes Mother   . Heart attack Mother   . Diabetes Maternal Aunt     Social History Social History   Tobacco Use  . Smoking status: Former Smoker    Types: Cigarettes  . Smokeless tobacco: Never Used  Substance Use Topics  . Alcohol use: Yes    Comment: beer  . Drug use: No     Allergies   Doxycycline   Review of Systems Review of Systems   Physical Exam Triage Vital Signs ED Triage Vitals  Enc Vitals Group     BP 02/24/17 1055 123/74     Pulse Rate 02/24/17 1055 72     Resp 02/24/17 1055 16     Temp 02/24/17 1055 98.2 F (36.8 C)     Temp Source 02/24/17 1055 Oral     SpO2 02/24/17 1055 98 %     Weight --      Height --      Head Circumference --      Peak Flow --      Pain Score 02/24/17 1114 2     Pain Loc --      Pain Edu? --      Excl. in [place]? --  No data found.  Updated Vital Signs BP 123/74 (BP Location: Right Arm)   Pulse 72   Temp 98.2 F (36.8 C) (Oral)   Resp 16   SpO2 98%   Visual Acuity Right Eye Distance:   Left Eye Distance:   Bilateral Distance:    Right Eye Near:   Left Eye Near:    Bilateral Near:     Physical Exam  Constitutional: She is oriented to person, place, and time. She appears well-developed and well-nourished. No distress.  Cardiovascular: Normal rate, regular rhythm and normal heart sounds.  Pulmonary/Chest: Effort normal and breath sounds normal.  Neurological: She is alert and oriented to person, place, and time.  Skin: Skin is warm and dry. Burn noted.  Second degree burn to right hand proximal to thumb and to wrist and dorsal hand, see photos. Skin remains intact         UC Treatments / Results  Labs (all labs ordered are listed, but only abnormal results are displayed) Labs Reviewed - No data to display  EKG  EKG Interpretation None       Radiology No results found.  Procedures Procedures (including critical care time)  Medications Ordered in  UC Medications - No data to display   Initial Impression / Assessment and Plan / UC Course  I have reviewed the triage vital signs and the nursing notes.  Pertinent labs & imaging results that were available during my care of the patient were reviewed by me and considered in my medical decision making (see chart for details).     Wound care discussed, silvadene cream daily. Discussed signs of infection and return precautions provided. If symptoms worsen or do not improve in the next week to return to be seen or to follow up with PCP.  Patient verbalized understanding and agreeable to plan.    Final Clinical Impressions(s) / UC Diagnoses   Final diagnoses:  Partial thickness burn of multiple sites of right hand, initial encounter    ED Discharge Orders        Ordered    silver sulfADIAZINE (SILVADENE) 1 % cream  Daily     02/24/17 1143       Controlled Substance Prescriptions [place] Controlled Substance Registry consulted? Not Applicable   Zigmund Gottron, NP 02/24/17 1148

## 2019-03-31 ENCOUNTER — Ambulatory Visit: Payer: BC Managed Care – PPO | Attending: Internal Medicine

## 2019-03-31 DIAGNOSIS — Z20822 Contact with and (suspected) exposure to covid-19: Secondary | ICD-10-CM

## 2019-04-01 LAB — NOVEL CORONAVIRUS, NAA: SARS-CoV-2, NAA: NOT DETECTED

## 2021-12-24 ENCOUNTER — Other Ambulatory Visit (HOSPITAL_COMMUNITY): Payer: Self-pay

## 2021-12-30 ENCOUNTER — Other Ambulatory Visit (HOSPITAL_COMMUNITY): Payer: Self-pay

## 2022-03-19 ENCOUNTER — Other Ambulatory Visit (HOSPITAL_COMMUNITY): Payer: Self-pay

## 2022-11-14 LAB — COLOGUARD: COLOGUARD: NEGATIVE

## 2022-11-14 LAB — EXTERNAL GENERIC LAB PROCEDURE: COLOGUARD: NEGATIVE
